# Patient Record
Sex: Female | Born: 2000 | Race: White | Hispanic: No | Marital: Single
Health system: Southern US, Community
[De-identification: ages and names within clinical notes are randomized; demographics above are authoritative.]

## PROBLEM LIST (undated history)

## (undated) DIAGNOSIS — J329 Chronic sinusitis, unspecified: Secondary | ICD-10-CM

## (undated) DIAGNOSIS — H539 Unspecified visual disturbance: Secondary | ICD-10-CM

## (undated) DIAGNOSIS — F909 Attention-deficit hyperactivity disorder, unspecified type: Secondary | ICD-10-CM

## (undated) DIAGNOSIS — J310 Chronic rhinitis: Secondary | ICD-10-CM

## (undated) HISTORY — PX: TYMPANOSTOMY TUBE PLACEMENT: SHX32

## (undated) HISTORY — PX: TONSILLECTOMY: SUR1361

## (undated) HISTORY — PX: ADENOIDECTOMY: SUR15

## (undated) HISTORY — DX: Attention-deficit hyperactivity disorder, unspecified type: F90.9

## (undated) HISTORY — DX: Unspecified visual disturbance: H53.9

## (undated) HISTORY — DX: Chronic rhinitis: J31.0

## (undated) HISTORY — DX: Chronic sinusitis, unspecified: J32.9

---
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
## 2023-01-11 DIAGNOSIS — H52203 Unspecified astigmatism, bilateral: Secondary | ICD-10-CM | POA: Diagnosis not present

## 2023-01-11 DIAGNOSIS — H5203 Hypermetropia, bilateral: Secondary | ICD-10-CM | POA: Diagnosis not present

## 2023-01-26 DIAGNOSIS — Z124 Encounter for screening for malignant neoplasm of cervix: Secondary | ICD-10-CM | POA: Diagnosis not present

## 2023-01-26 DIAGNOSIS — Z01419 Encounter for gynecological examination (general) (routine) without abnormal findings: Secondary | ICD-10-CM | POA: Diagnosis not present

## 2023-01-26 DIAGNOSIS — N898 Other specified noninflammatory disorders of vagina: Secondary | ICD-10-CM | POA: Diagnosis not present

## 2023-01-26 DIAGNOSIS — L680 Hirsutism: Secondary | ICD-10-CM | POA: Diagnosis not present

## 2023-01-26 DIAGNOSIS — N926 Irregular menstruation, unspecified: Secondary | ICD-10-CM | POA: Diagnosis not present

## 2023-01-26 DIAGNOSIS — Z113 Encounter for screening for infections with a predominantly sexual mode of transmission: Secondary | ICD-10-CM | POA: Diagnosis not present

## 2023-02-02 DIAGNOSIS — N926 Irregular menstruation, unspecified: Secondary | ICD-10-CM | POA: Diagnosis not present

## 2023-02-02 DIAGNOSIS — L680 Hirsutism: Secondary | ICD-10-CM | POA: Diagnosis not present

## 2023-02-15 DIAGNOSIS — N926 Irregular menstruation, unspecified: Secondary | ICD-10-CM | POA: Diagnosis not present

## 2023-02-15 DIAGNOSIS — L680 Hirsutism: Secondary | ICD-10-CM | POA: Diagnosis not present

## 2023-02-15 DIAGNOSIS — Z30011 Encounter for initial prescription of contraceptive pills: Secondary | ICD-10-CM | POA: Diagnosis not present

## 2023-07-04 DIAGNOSIS — L680 Hirsutism: Secondary | ICD-10-CM | POA: Diagnosis not present

## 2023-08-27 ENCOUNTER — Ambulatory Visit
Admission: EM | Admit: 2023-08-27 | Discharge: 2023-08-27 | Disposition: A | Discharge status: Home / Self Care | Payer: BC Managed Care – PPO

## 2023-08-27 DIAGNOSIS — S0501XA Injury of conjunctiva and corneal abrasion without foreign body, right eye, initial encounter: Secondary | ICD-10-CM | POA: Diagnosis not present

## 2023-08-27 DIAGNOSIS — H578A1 Foreign body sensation, right eye: Secondary | ICD-10-CM | POA: Diagnosis not present

## 2023-08-27 MED ORDER — POLYMYXIN B-TRIMETHOPRIM 10000-0.1 UNIT/ML-% OP SOLN: 1.0000 [drp] | Freq: Four times a day (QID) | OPHTHALMIC | 0 refills | Status: AC

## 2023-08-27 NOTE — ED Provider Notes (Signed)
Sylvia Reilly    CSN: [id number] Arrival date & time: 08/27/23  1212      History   Chief Complaint Chief Complaint  Patient presents with   Eye Problem    HPI Sylvia Reilly is a 22 y.o. female.  Patient presents with right eye irritation and foreign body sensation since this afternoon.  She was at work and felt like she got dust or something in her eye.  No eye injury.  Her eye has been tearing but no purulent drainage.  No acute eye pain, change in vision, fever, or other symptoms.  The history is provided by the patient and medical records.    Past Medical History:  Diagnosis Date   ADHD (attention deficit hyperactivity disorder) started 03/20/2008   Chronic atrophic rhinitis    Recurrent sinusitis    Abnormal Sinus CT 04/2006, eval by ENT and allergist   Vision abnormalities    hyperopia, astigmatism    Patient Active Problem List   Diagnosis Date Noted   Otitis media 05/18/2013   Seasonal allergies 05/18/2013   Vision abnormalities    ADHD (attention deficit hyperactivity disorder) 08/15/2011    Past Surgical History:  Procedure Laterality Date   ADENOIDECTOMY     TONSILLECTOMY     TYMPANOSTOMY TUBE PLACEMENT      OB History   No obstetric history on file.      Home Medications    Prior to Admission medications   Medication Sig Start Date End Date Taking? Authorizing Provider  Norethindrone Acetate-Ethinyl Estrad-FE (JUNEL FE 24) 1-20 MG-MCG(24) tablet 1 tablet Orally Once a day for 84 days 02/15/23  Yes [provider]  trimethoprim-polymyxin b (POLYTRIM) ophthalmic solution Place 1 drop into the right eye 4 (four) times daily for 7 days. 08/27/23 09/03/23 Yes Mickie Bail, NP  amoxicillin (AMOXIL) 500 MG capsule Take 1 capsule (500 mg total) by mouth 2 (two) times daily. 05/18/13   Georgiann Hahn, MD  cetirizine (ZYRTEC) 10 MG tablet One tab before bedtime for allergies. 05/18/13 09/15/13  Georgiann Hahn, MD  fluticasone (FLONASE)  50 MCG/ACT nasal spray Place 1 spray into the nose daily. 05/18/13 05/18/14  Georgiann Hahn, MD  methylphenidate (CONCERTA) 27 MG CR tablet Take 1 tablet (27 mg total) by mouth daily with breakfast. 05/16/13 06/15/13  Georgiann Hahn, MD  Pediatric Multivit-Minerals-C (CHILDRENS MULTIVITAMIN PO) Take by mouth.      [provider]    Family History Family History  Problem Relation Age of Onset   Hypertension Paternal Grandmother    Hyperlipidemia Paternal Grandmother     Social History Social History   Tobacco Use   Smoking status: Passive Smoke Exposure - Never Smoker   Smokeless tobacco: Never  Substance Use Topics   Alcohol use: No   Drug use: No     Allergies   Zithromax [azithromycin]   Review of Systems Review of Systems  Constitutional:  Negative for chills and fever.  Eyes:  Positive for discharge and redness. Negative for photophobia, pain, itching and visual disturbance.  Skin:  Negative for color change and wound.     Physical Exam Triage Vital Signs ED Triage Vitals  Encounter Vitals Group     BP 08/27/23 1252 103/69     Systolic BP Percentile --      Diastolic BP Percentile --      Pulse Rate 08/27/23 1243 (!) 105     Resp 08/27/23 1243 18     Temp 08/27/23 1243  98.1 F (36.7 C)     Temp src --      SpO2 08/27/23 1243 98 %     Weight --      Height --      Head Circumference --      Peak Flow --      Pain Score 08/27/23 1249 6     Pain Loc --      Pain Education --      Exclude from Growth Chart --    No data found.  Updated Vital Signs BP 103/69   Pulse (!) 105   Temp 98.1 F (36.7 C)   Resp 18   LMP 08/13/2023   SpO2 98%   Visual Acuity Right Eye Distance: 20/30 Left Eye Distance: 20/30 Bilateral Distance: 20/30  Right Eye Near:   Left Eye Near:    Bilateral Near:     Physical Exam Constitutional:      General: She is not in acute distress.    Appearance: She is not ill-appearing.  HENT:     Mouth/Throat:      Mouth: Mucous membranes are moist.  Eyes:     General: Lids are normal. Vision grossly intact.        Right eye: No discharge.        Left eye: No discharge.     Extraocular Movements: Extraocular movements intact.     Pupils: Pupils are equal, round, and reactive to light.     Right eye: Fluorescein uptake present.      Comments: No foreign body noted.  Cardiovascular:     Rate and Rhythm: Normal rate and regular rhythm.     Heart sounds: Normal heart sounds.  Pulmonary:     Effort: Pulmonary effort is normal. No respiratory distress.     Breath sounds: Normal breath sounds.  Skin:    General: Skin is warm and dry.     Findings: No bruising, erythema, lesion or rash.  Neurological:     Mental Status: She is alert.  Psychiatric:        Mood and Affect: Mood normal.        Behavior: Behavior normal.      UC Treatments / Results  Labs (all labs ordered are listed, but only abnormal results are displayed) Labs Reviewed - No data to display  EKG   Radiology No results found.  Procedures Procedures (including critical care time)  Medications Ordered in UC Medications - No data to display  Initial Impression / Assessment and Plan / UC Course  I have reviewed the triage vital signs and the nursing notes.  Pertinent labs & imaging results that were available during my care of the patient were reviewed by me and considered in my medical decision making (see chart for details).    Small right corneal abrasion, foreign body sensation in the right eye.  No foreign body appreciated.  Small area of fluorescein uptake.  Treating with Polytrim eyedrops.  Instructed patient to follow-up with her eye care provider tomorrow.  ED precautions given.  Education provided on corneal abrasion.  Patient agrees to plan of care.  Final Clinical Impressions(s) / UC Diagnoses   Final diagnoses:  Abrasion of right cornea, initial encounter  Foreign body sensation, right eye      Discharge Instructions      Use the antibiotic eyedrops as prescribed.    Follow-up with your eye care provider tomorrow.    Go to the emergency department if you  have acute eye pain, changes in your vision, or other concerning symptoms.        ED Prescriptions     Medication Sig Dispense Auth. Provider   trimethoprim-polymyxin b (POLYTRIM) ophthalmic solution Place 1 drop into the right eye 4 (four) times daily for 7 days. 10 mL Mickie Bail, NP      PDMP not reviewed this encounter.   Mickie Bail, NP 08/27/23 1325

## 2023-08-27 NOTE — Discharge Instructions (Addendum)
Use the antibiotic eyedrops as prescribed.    Follow-up with your eye care provider tomorrow.    Go to the emergency department if you have acute eye pain, changes in your vision, or other concerning symptoms.

## 2023-08-27 NOTE — ED Triage Notes (Signed)
Patient to Urgent Care with complaints of right sided eye irritation that started this afternoon.  Reports possible foreign object- states she was fixing a strap at work and started experiencing some eye pain w/ blinking. No drainage. Has had a little bit of watering. Feels like something could be in the corner of her eye.

## 2024-10-29 ENCOUNTER — Ambulatory Visit (HOSPITAL_COMMUNITY): Admission: EM | Admit: 2024-10-29 | Discharge: 2024-10-29 | Discharge status: Against Medical Advice

## 2024-10-29 NOTE — ED Notes (Signed)
 Pt states she is here for repeat HSV labs since her last ones were neg at urgent care. Pt decided to not stay for visit she is ok knowing her labs were neg for HSV. Advised pt she can stay for OV and be seen she declined.
